# Patient Record
Sex: Male | Born: 1990 | Race: Black or African American | Hispanic: No | Marital: Single | Smoking: Current some day smoker
Health system: Southern US, Community
[De-identification: ages and names within clinical notes are randomized; demographics above are authoritative.]

---
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
## 2009-04-16 ENCOUNTER — Observation Stay (HOSPITAL_COMMUNITY): Admission: EM | Admit: 2009-04-16 | Discharge: 2009-04-16 | Payer: Self-pay | Admitting: Emergency Medicine

## 2009-05-11 ENCOUNTER — Encounter: Admission: RE | Admit: 2009-05-11 | Discharge: 2009-05-15 | Payer: Self-pay | Admitting: Plastic Surgery

## 2009-05-15 ENCOUNTER — Encounter: Admission: RE | Admit: 2009-05-15 | Discharge: 2009-07-17 | Payer: Self-pay | Admitting: Plastic Surgery

## 2010-08-14 LAB — POCT I-STAT, CHEM 8
BUN: 20 mg/dL (ref 6–23)
Calcium, Ion: 1.02 mmol/L — ABNORMAL LOW (ref 1.12–1.32)
Chloride: 109 meq/L (ref 96–112)
Creatinine, Ser: 1.2 mg/dL (ref 0.4–1.5)
Glucose, Bld: 132 mg/dL — ABNORMAL HIGH (ref 70–99)
HCT: 49 % (ref 39.0–52.0)
Hemoglobin: 16.7 g/dL (ref 13.0–17.0)
Potassium: 4.1 mEq/L (ref 3.5–5.1)
Sodium: 143 mEq/L (ref 135–145)
TCO2: 23 mmol/L (ref 0–100)

## 2015-05-22 ENCOUNTER — Emergency Department (HOSPITAL_COMMUNITY)
Admission: EM | Admit: 2015-05-22 | Discharge: 2015-05-22 | Disposition: A | Discharge status: Home / Self Care | Payer: Self-pay | Attending: Emergency Medicine | Admitting: Emergency Medicine

## 2015-05-22 ENCOUNTER — Encounter (HOSPITAL_COMMUNITY): Payer: Self-pay | Admitting: Family Medicine

## 2015-05-22 ENCOUNTER — Emergency Department (HOSPITAL_COMMUNITY): Payer: Self-pay

## 2015-05-22 DIAGNOSIS — S0093XA Contusion of unspecified part of head, initial encounter: Secondary | ICD-10-CM | POA: Insufficient documentation

## 2015-05-22 DIAGNOSIS — Y998 Other external cause status: Secondary | ICD-10-CM | POA: Insufficient documentation

## 2015-05-22 DIAGNOSIS — S199XXA Unspecified injury of neck, initial encounter: Secondary | ICD-10-CM | POA: Insufficient documentation

## 2015-05-22 DIAGNOSIS — W000XXA Fall on same level due to ice and snow, initial encounter: Secondary | ICD-10-CM | POA: Insufficient documentation

## 2015-05-22 DIAGNOSIS — W19XXXA Unspecified fall, initial encounter: Secondary | ICD-10-CM

## 2015-05-22 DIAGNOSIS — Y9389 Activity, other specified: Secondary | ICD-10-CM | POA: Insufficient documentation

## 2015-05-22 DIAGNOSIS — F17200 Nicotine dependence, unspecified, uncomplicated: Secondary | ICD-10-CM | POA: Insufficient documentation

## 2015-05-22 DIAGNOSIS — Z791 Long term (current) use of non-steroidal anti-inflammatories (NSAID): Secondary | ICD-10-CM | POA: Insufficient documentation

## 2015-05-22 DIAGNOSIS — S0012XA Contusion of left eyelid and periocular area, initial encounter: Secondary | ICD-10-CM | POA: Insufficient documentation

## 2015-05-22 DIAGNOSIS — S0990XA Unspecified injury of head, initial encounter: Secondary | ICD-10-CM

## 2015-05-22 DIAGNOSIS — Y9289 Other specified places as the place of occurrence of the external cause: Secondary | ICD-10-CM | POA: Insufficient documentation

## 2015-05-22 MED ORDER — FLUORESCEIN SODIUM 1 MG OP STRP
1.0000 | ORAL_STRIP | Freq: Once | OPHTHALMIC | Status: AC
  Administered 2015-05-22: 1 via OPHTHALMIC
  Filled 2015-05-22: qty 1

## 2015-05-22 MED ORDER — IBUPROFEN 800 MG PO TABS: 800.0000 mg | ORAL_TABLET | Freq: Three times a day (TID) | ORAL | Status: DC

## 2015-05-22 MED ORDER — TETRACAINE HCL 0.5 % OP SOLN
2.0000 [drp] | Freq: Once | OPHTHALMIC | Status: AC
  Administered 2015-05-22: 2 [drp] via OPHTHALMIC
  Filled 2015-05-22: qty 2

## 2015-05-22 NOTE — Discharge Instructions (Signed)
Take your medication as prescribed as needed for pain relief. I also recommend applying ice for 15-20 minutes 3-4 times daily to help with your pain and swelling. Please follow up with a primary care provider from the Resource Guide provided below in 3-4 days. Please return to the Emergency Department if symptoms worsen or new onset of fever, headache, visual changes, lightheadedness, dizziness, numbness, tingling, weakness, change in mental status, seemed to be, seizure.   Emergency Department Resource Guide 1) Find a Doctor and Pay Out of Pocket Although you won't have to find out who is covered by your insurance plan, it is a good idea to ask around and get recommendations. You will then need to call the office and see if the doctor you have chosen will accept you as a new patient and what types of options they offer for patients who are self-pay. Some doctors offer discounts or will set up payment plans for their patients who do not have insurance, but you will need to ask so you aren't surprised when you get to your appointment.  2) Contact Your Local Health Department Not all health departments have doctors that can see patients for sick visits, but many do, so it is worth a call to see if yours does. If you don't know where your local health department is, you can check in your phone book. The CDC also has a tool to help you locate your state's health department, and many state websites also have listings of all of their local health departments.  3) Find a Walk-in Clinic If your illness is not likely to be very severe or complicated, you may want to try a walk in clinic. These are popping up all over the country in pharmacies, drugstores, and shopping centers. They're usually staffed by nurse practitioners or physician assistants that have been trained to treat common illnesses and complaints. They're usually fairly quick and inexpensive. However, if you have serious medical issues or chronic  medical problems, these are probably not your best option.  No Primary Care Doctor: - Call Health Connect at  [phone] - they can help you locate a primary care doctor that  accepts your insurance, provides certain services, etc. - Physician Referral Service- [phone]  Chronic Pain Problems: Organization         Address  Phone   Notes  [facility]  [phone] Patients need to be referred by their primary care doctor.   Medication Assistance: Organization         Address  Phone   Notes  [facility] [address] [place], [place] [address] [phone]) [phone] --Must be a resident of [place] -- Must have NO insurance coverage whatsoever (no Medicaid/ Medicare, etc.) -- The pt. MUST have a primary care doctor that directs their care regularly and follows them in the community   MedAssist  [phone]) [phone]   [employer]  [phone]    Agencies that provide inexpensive medical care: Organization         Address  Phone   Notes  [facility]  [phone]   [facility]    [phone]   [facility] [address] [place], [place] [address] [phone]   [facility] [place] [address], [place] [phone]   [facility]    [phone]   [facility]    [phone]) [phone]  Community Health and [place]  [address], [place] Phone:  [phone], Fax:  [phone] Hours of Operation:  9 am - 6 pm, M-F.  Also accepts Medicaid/Medicare and self-pay.  [facility] for [place] [address], [address], [place] Phone: [phone], Fax: [phone]. Hours of Operation:  8:30 am - 5:30 pm, M-F.  Also accepts Medicaid and self-pay.  [facility] [place] [address] [place] Phone: [phone]   [place], [place], [place] [phone],  Ext. [phone] Mondays & Thursdays: 7-9 AM.  First 15 patients are seen on a first come, first serve basis.    [place] Providers:  Organization         Address  Phone   Notes  [facility] [address], [place] [phone] Also accepts self-pay patients.  [facility] [id number] [place], [place]  [phone]   [place], [address], [place] [phone]   [facility] [address], [place] [phone]   Lucianne Lei [address], [place]   [phone] Only accepts [place] Access [place] patients after they have their name applied to their card.   Self-Pay (no insurance) in [facility]:  Organization         Address  Phone   Notes  Sickle Cell Patients, [facility] [place] [phone]   [facility] Care [place] [phone]   [facility] [place]  [place], [place], [place] [phone]   [facility]/Dr. Osei-Bonsu  [address], [place] [phone] Phone number for both [place] and [place] locations is the same.  Urgent Medical and [facility] [address] [place] [phone]   [facility] [address], [place] or [address] [phone]   [facility] [address], [place] [phone], phone; [phone], fax Sees patients 1st and 3rd Saturday of every month.  Must not qualify for public or private insurance (i.e. Medicaid, Medicare, [place] Health Choice, Veterans' Benefits)  Household income should be no more than 200% of the poverty level The clinic cannot treat you if you are pregnant or think you are pregnant  Sexually transmitted diseases are not  treated at the clinic.    Dental Care: Organization         Address  Phone  Notes  [facility] Department of [facility] [place] [phone] Accepts children up to age 60 who are enrolled in [place] or [place]; pregnant women with a Medicaid card; and children who have applied for Medicaid or [place] Health Choice, but were declined, whose parents can pay a reduced fee at time of service.  [facility] Department of [facility]  [address], [place] [phone] Accepts children up to age 20 who are enrolled in [place] or [place]; pregnant women with a Medicaid card; and children who have applied for Medicaid or [place] Health Choice, but were declined, whose parents can pay a reduced fee at time of service.  [place]  [phone]  [place] [phone] Patients are seen by appointment only. Walk-ins are not accepted. [place] will see patients 19 years of age and older. Monday - Tuesday (8am-5pm) Most Wednesdays (8:30-5pm) $30 per visit, cash only  [facility] Adult Dental Access PROGRAM  [address], [facility] [phone] Patients are seen by appointment only. Walk-ins are not accepted. [place] will see patients 68 years of age and older. One Wednesday Evening (Monthly: Volunteer Based).  $30 per visit, cash only  [place]  [phone] for adults; Children under age 42, call Graduate Pediatric Dentistry at [phone]. Children aged 50-14, please call [phone] to request a pediatric application.  Dental services are provided in all areas of dental care including fillings, crowns and bridges, complete and partial dentures, implants, gum treatment, root canals, and extractions. Preventive care is also provided. Treatment is provided to both adults and children. Patients are selected via a lottery and there is  often a waiting list.   [facility] [address] [place]  [phone] [email or web address]   [facility] [address] [place], [place] [phone], Ext. [phone] Second and Fourth Thursday of each month, opens at 6:30 AM; Clinic ends at 9 AM.  Patients are seen on a first-come first-served basis, and a limited number are seen during each clinic.   [facility]  [address] Hillard Danker [place], [place] [phone]   Eligibility Requirements You must have lived in [place], [place], or [place] counties for at least the last three months.   You cannot be eligible for state or federal sponsored [employer], including [employer], [place], or [employer].   You generally cannot be eligible for healthcare insurance through your employer.    How to apply: Eligibility screenings are held every Tuesday and Wednesday afternoon from 1:00 pm until 4:00 pm. You do not need an appointment for the interview!  [facility] [address] [place], [place]   [place]  [place] Department  [place]  [phone]    Behavioral Health Resources in the Community: Intensive Outpatient Programs Organization         Address  Phone  Notes  [place]. [address] [place], [place] [phone]   [facility] Outpatient [address], [place], [place]   ADS: Alcohol & Drug Svcs [address], [place], [place]   [place] [address]  [place], [place] or [phone]   Substance Abuse Resources Organization         Address  Phone  Notes  Alcohol and Drug Services  [phone]   [place]  [phone]   The [place]   Chinita Pester  [phone]   Residential & Outpatient Substance  Abuse Program  [phone]   Psychological Services Organization         Address  Phone  Notes  [facility] [place]  [place]  [phone]   [place] [address] [place] or [phone]    Mobile Crisis Teams Organization         Address  Phone  Notes  [facility], Mobile Crisis Care Unit  [phone]   Assertive Psychotherapeutic Services  [address] [place], [place]  [facility] [place] [address] [place] [phone]    Self-Help/Support Groups Organization         Address  Phone             Notes  [facility] Health Assoc[facility] of [place] - variety of support groups  [place] Call for more information  Narcotics Anonymous (NA), Caring Services [address], [employer] [place]  2 meetings at this location   Special educational needs teacher         Address  Phone  Notes  [facility] [place],    [place]  [phone]   [facility]  [address], [place] [phone], [place], [place]   [place], [place] [phone] Admissions: 8am-3pm M-F  Incentives Substance [place] [address]    [place], [place] [phone]   [facility] [address] [place], [place], [place]   [facility] [address],  [place], [place]   [facility] Programs - Intensive Outpatient [address] Kristeen [address], [place], [place]   [facility].,  [place], [place] [phone] or [phone]   [facility]idential Treatment Services (RTS) [address] [place], [place] Accepts Medicaid  [facility] [place] [address],  [place] [phone] Substance Abuse/Addiction Treatment   [facility] Organization          Address  Phone  Notes  [facility]  [phone]   Domenic Schwab, PhD [address] [place]lis [place], [place]   [phone] or [phone]   [place], [place] [phone]   [facility] [address], [place], [place] [phone] Insurance/Medicaid/sponsorship through [facility] and Families [address] [place]                                    [place], [place] [phone] [place] [address] [place], [place] [phone]    Dr. Adele Schilder  [phone]   [facility] Dept. 1) [address], [place] 2) [place] 3)  [place] [address], [place] [phone]  [phone]   [place] [phone] or [phone] (After Hours)

## 2015-05-22 NOTE — ED Provider Notes (Signed)
CSN: [id number]     Arrival date & time 05/22/15  1310 History  By signing my name below, I, Larry Melendez, attest that this documentation has been prepared under the direction and in the presence of Larry HakeNicole Moncia Annas, PA-C. Electronically Signed: Tanda RockersMargaux Melendez, ED Scribe. 05/22/2015. 5:22 PM.   Chief Complaint  Patient presents with  . Fall   The history is provided by the patient. No language interpreter was used.     HPI Comments: Larry Melendez is a 25 y.o. male who presents to the Emergency Department complaining of sudden onset, constant, 7/10, left eye pain s/p ground level fall that occurred last night. Pt reports that he was playing in the snow last night when he slipped on ice and fell, landing face first on the ice. No LOC. He also complains of mild blurry vision to the left eye, watering to the eyes, left parietal head pain, and right lateral neck pain. He applied ice to the eye after the incident with some relief from the swelling. He has not taken anything for the pain. Denies fever, eye discharge, foreign body sensation to eye, lightheadedness, dizziness, shortness of breath, chest pain, back pain, numbness, tingling, weakness, or any other associated symptoms. Pt wears corrective lenses. He is not on any anticoagulants.   History reviewed. No pertinent past medical history. History reviewed. No pertinent past surgical history. History reviewed. No pertinent family history. Social History  Substance Use Topics  . Smoking status: Current Some Day Smoker  . Smokeless tobacco: None  . Alcohol Use: Yes    Review of Systems  Constitutional: Negative for fever.  Eyes: Positive for pain (Left) and visual disturbance (Blurry vision. ). Negative for discharge.       + Watering to left eye  Respiratory: Negative for shortness of breath.   Cardiovascular: Negative for chest pain.  Musculoskeletal: Positive for neck pain. Negative for back pain.  Neurological: Negative for dizziness,  weakness, light-headedness and numbness.  All other systems reviewed and are negative.  Allergies  Review of patient's allergies indicates no known allergies.  Home Medications   Prior to Admission medications   Medication Sig Start Date End Date Taking? Authorizing Provider  ibuprofen (ADVIL,MOTRIN) 800 MG tablet Take 1 tablet (800 mg total) by mouth 3 (three) times daily. 05/22/15   Barrett HenleNicole Elizabeth Traylon Schimming, PA-C   Triage Vitals:  BP 135/69 mmHg  Pulse 101  Temp(Src) 98.7 F (37.1 C) (Oral)  Resp 14  Ht 5\' 7"  (1.702 m)  Wt 145 lb 3 oz (65.857 kg)  BMI 22.73 kg/m2  SpO2 99%   Physical Exam  Constitutional: He is oriented to person, place, and time. He appears well-developed and well-nourished. No distress.  HENT:  Head: Normocephalic. Head is with contusion.    Right Ear: Tympanic membrane normal. No hemotympanum.  Left Ear: Tympanic membrane normal. No hemotympanum.  Nose: Nose normal. No epistaxis. Right sinus exhibits no maxillary sinus tenderness and no frontal sinus tenderness. Left sinus exhibits no maxillary sinus tenderness and no frontal sinus tenderness.  Mouth/Throat: Uvula is midline and oropharynx is clear and moist.  Eyes: Conjunctivae and EOM are normal. Pupils are equal, round, and reactive to light. Right eye exhibits no discharge. Left eye exhibits no discharge. No scleral icterus.  Slit lamp exam:      The left eye shows no corneal abrasion, no corneal ulcer, no foreign body, no hyphema and no fluorescein uptake.  IOP 18  Neck: Normal range of motion. Neck supple.  No tracheal deviation present.  Cardiovascular: Normal rate, regular rhythm, normal heart sounds and intact distal pulses.   Pulmonary/Chest: Effort normal and breath sounds normal. No respiratory distress.  Abdominal: Soft. Bowel sounds are normal. He exhibits no distension. There is no tenderness.  Musculoskeletal: Normal range of motion.  Neurological: He is alert and oriented to person, place,  and time. He has normal strength. No cranial nerve deficit or sensory deficit. Coordination normal.  Skin: Skin is warm and dry.  Psychiatric: He has a normal mood and affect. His behavior is normal.  Nursing note and vitals reviewed.   ED Course  Procedures (including critical care time)  DIAGNOSTIC STUDIES: Oxygen Saturation is 99% on RA, normal by my interpretation.    COORDINATION OF CARE: 4:45 PM-Discussed treatment plan which includes CT Maxillofacial with pt at bedside and pt agreed to plan.   Labs Review Labs Reviewed - No data to display  Imaging Review Ct Maxillofacial Wo Cm  05/22/2015  CLINICAL DATA:  25 year old male with history of trauma from a fall yesterday, with swollen left eye. EXAM: CT MAXILLOFACIAL WITHOUT CONTRAST TECHNIQUE: Multidetector CT imaging of the maxillofacial structures was performed. Multiplanar CT image reconstructions were also generated. A small metallic BB was placed on the right temple in order to reliably differentiate right from left. COMPARISON:  No priors. FINDINGS: No acute displaced facial bone fractures. Specifically, pterygoid plates are intact, mandible is intact, mandibular condyles are located bilaterally, and bilateral orbits are intact. Small amount of soft tissue swelling around the left orbit, but the left globe itself and retro-orbital soft tissues are grossly normal in appearance. IMPRESSION: 1. No evidence of significant acute traumatic injury to the facial bones. 2. Mild soft tissue swelling around the left orbit, without acute abnormality of the left globe or retro-orbital soft tissues. Electronically Signed   By: Trudie Reed M.D.   On: 05/22/2015 18:18   I have personally reviewed and evaluated these images as part of my medical decision-making.  Filed Vitals:   05/22/15 1335 05/22/15 1825  BP: 135/69 130/58  Pulse: 101 100  Temp: 98.7 F (37.1 C) 98.7 F (37.1 C)  Resp: 14 16     MDM   Final diagnoses:  Head  injury, initial encounter  Fall, initial encounter   Patient presents with left eye pain and swelling after falling on ice last night. Denies LOC. VSS. Exam revealed mild swelling and ecchymosis to left upper eyelid and tenderness along left supraorbital region. Eye exam unremarkable, no fluroscein uptake. IOP 18. CT maxillofacial revealed no significant acute traumatic injury to facial bones. Plan to discharge patient home with NSAIDs, stress symptomatic treatment. Patient given resource guide to follow up with PCP.  Evaluation does not show pathology requring ongoing emergent intervention or admission. Pt is hemodynamically stable and mentating appropriately. Discussed findings/results and plan with patient/guardian, who agrees with plan. All questions answered. Return precautions discussed and outpatient follow up given.    I personally performed the services described in this documentation, which was scribed in my presence. The recorded information has been reviewed and is accurate.      Satira Sark [place], [place] 05/22/15 2250  Linwood Dibbles, MD 05/23/15 [phone]

## 2015-05-22 NOTE — ED Notes (Addendum)
Pt had slip and fall last night and hit head and left eye. Denies any dizziness, light headedness,nausea. Left eye swollen shut and bump to head.

## 2016-05-30 IMAGING — CT CT MAXILLOFACIAL W/O CM
3 series · 17 of 47 positions shown, 20 images · non-contrast
Comparison: No priors.

CLINICAL DATA: 24-year-old male with history of trauma from a fall
yesterday, with swollen left eye.

EXAM:
CT MAXILLOFACIAL WITHOUT CONTRAST
TECHNIQUE: Multidetector CT imaging of the maxillofacial structures was
performed. Multiplanar CT image reconstructions were also generated.
A small metallic BB was placed on the right temple in order to
reliably differentiate right from left.

[Series 201: facial bones, idose (1) · axial · 0.33mm/px · z∈[+30,+162]mm · 11 of 78 slices shown, 14 images]
[im 6/78  brain]
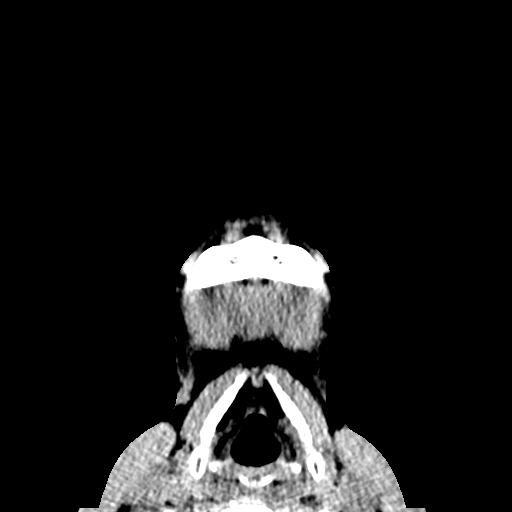
[im 6/78  bone]
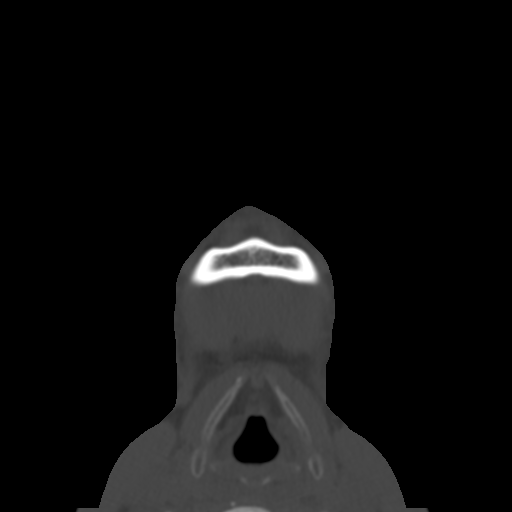
[im 11/78  bone]
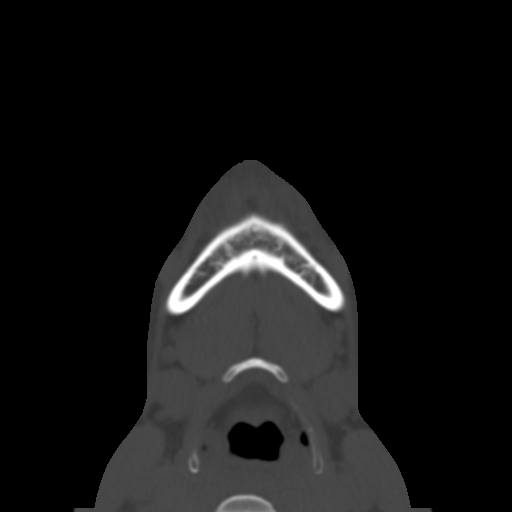
[im 19/78  bone]
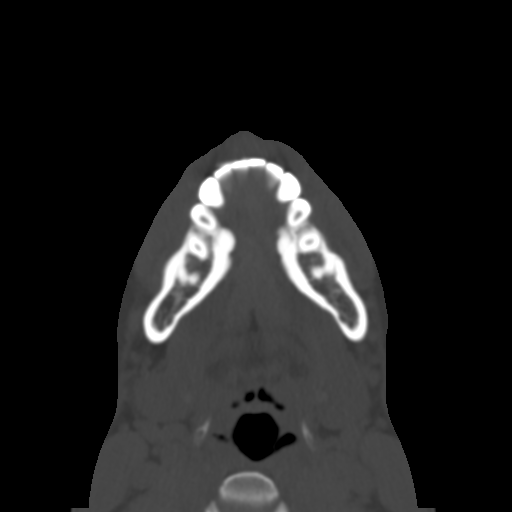
[im 24/78  bone]
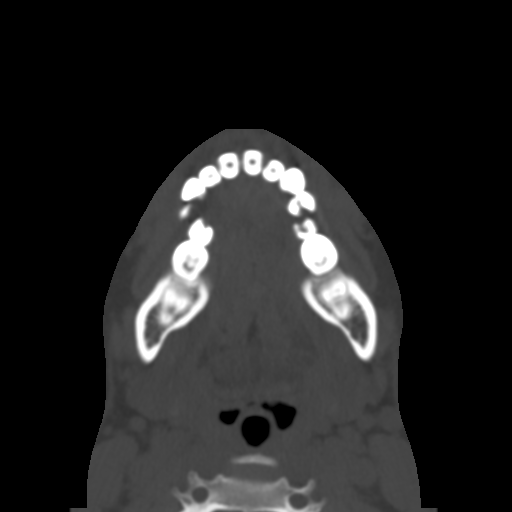
[im 32/78  brain]
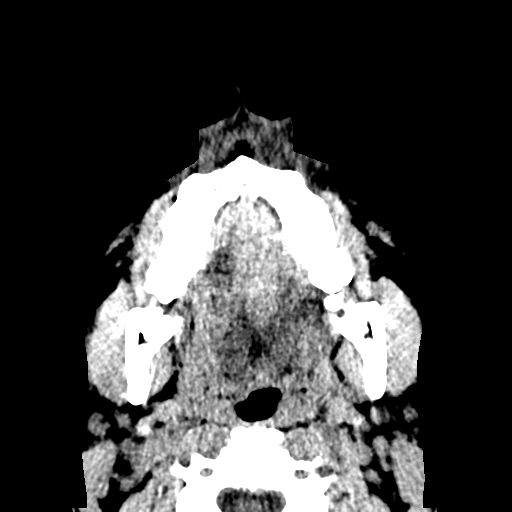
[im 32/78  bone]
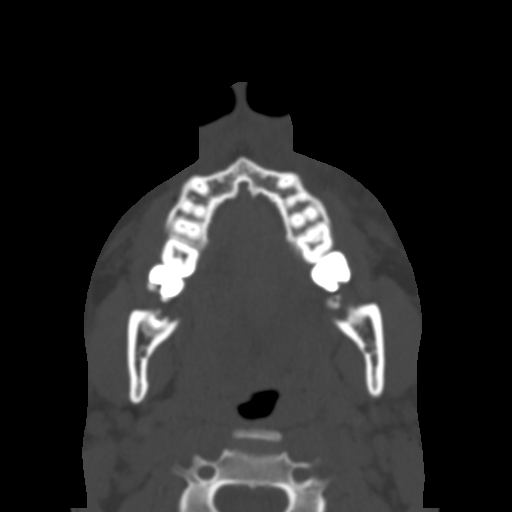
[im 40/78  bone]
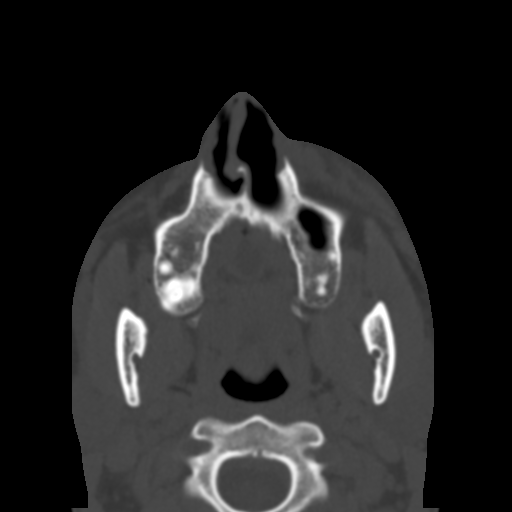
[im 46/78  bone]
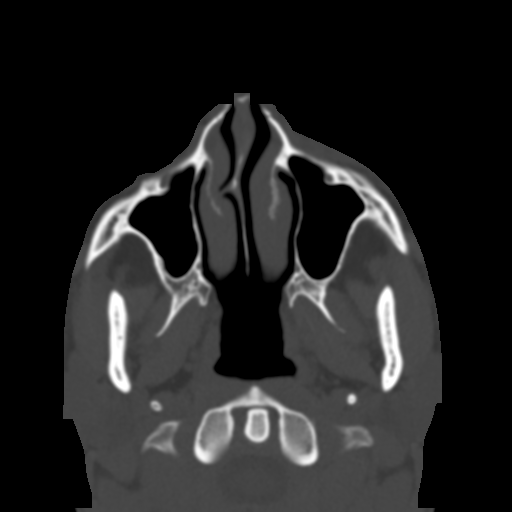
[im 54/78  bone]
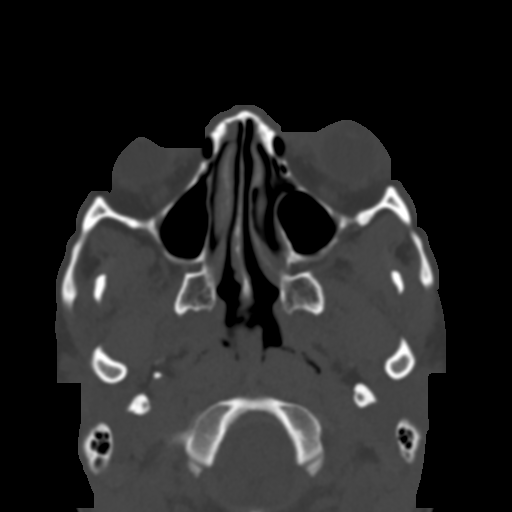
[im 59/78  brain]
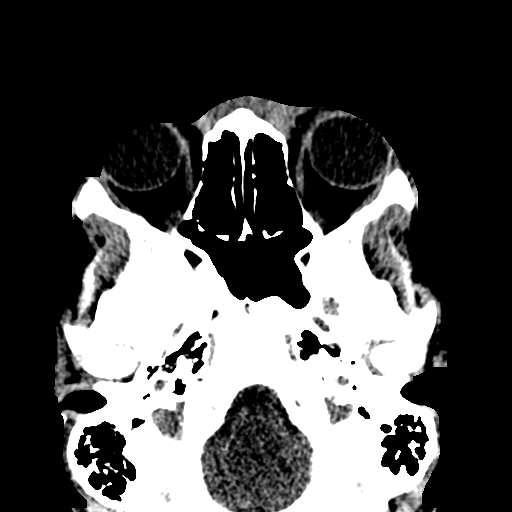
[im 59/78  bone]
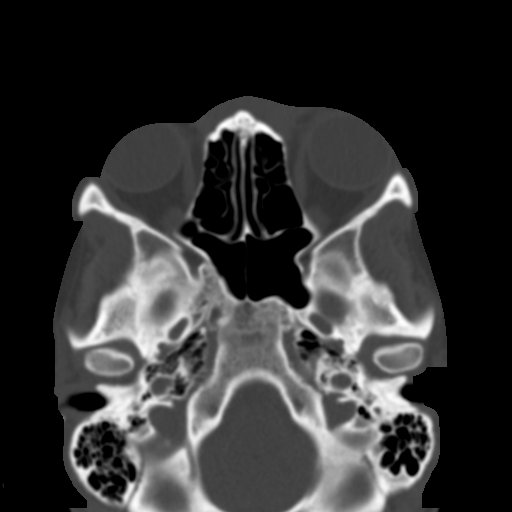
[im 67/78  bone]
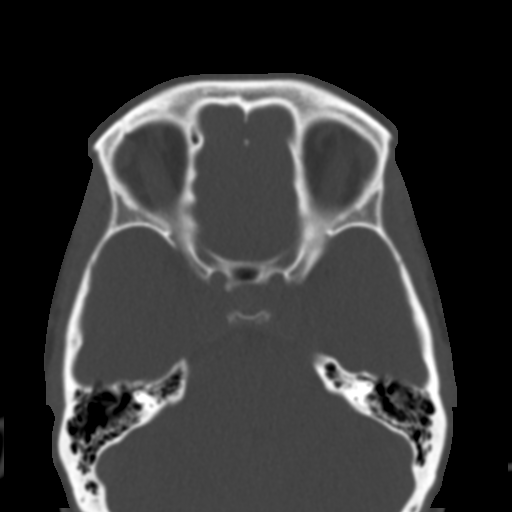
[im 72/78  bone]
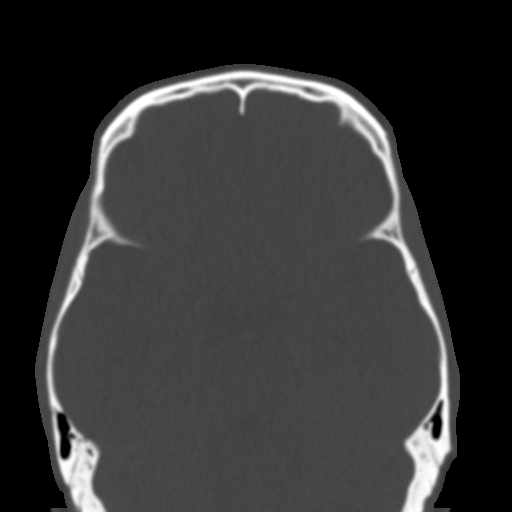

[Series 203: coronal std, idose (1) · coronal · 0.33mm/px · 3 of 75 slices shown]
[im 25/75  bone]
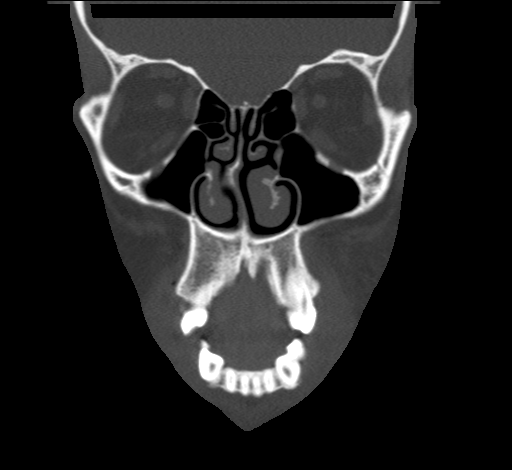
[im 33/75  bone]
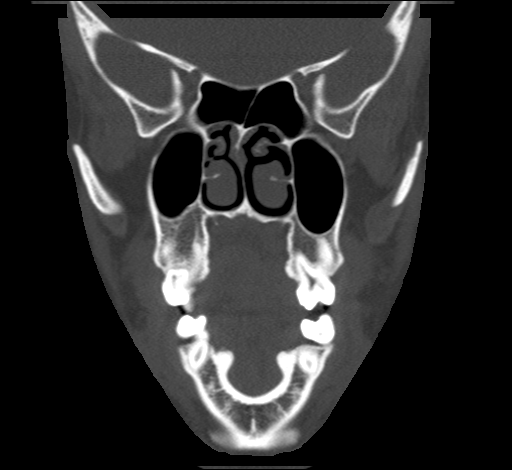
[im 42/75  bone]
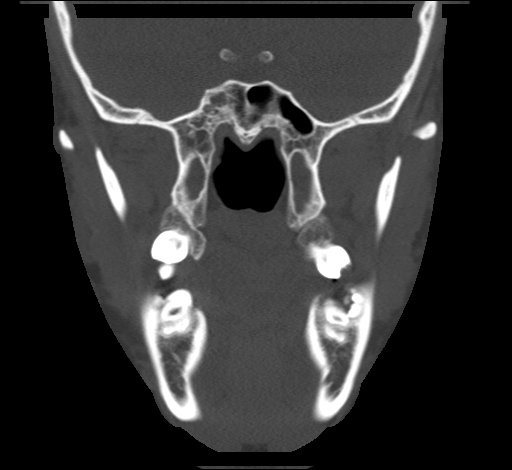

[Series 204: sagittal std, idose (1) · sagittal · 0.33mm/px · 3 of 83 slices shown]
[im 28/83  bone]
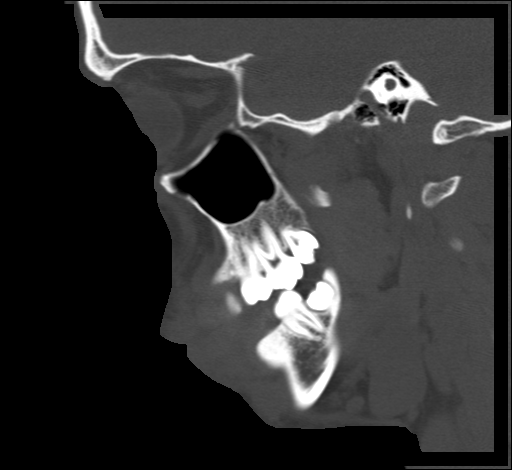
[im 42/83  bone]
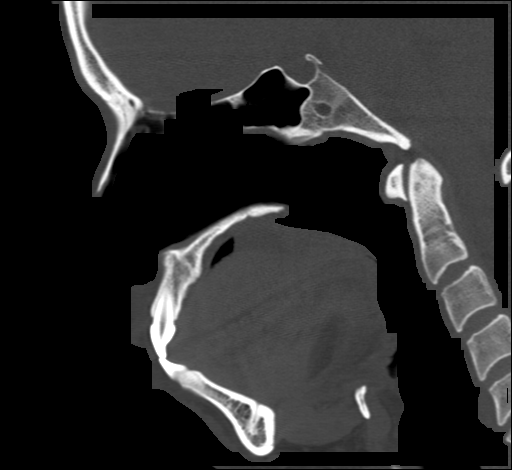
[im 55/83  bone]
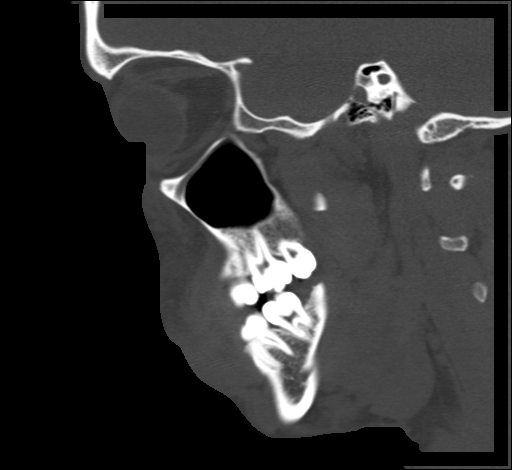

[17 of 47 positions shown; findings below may reference images not displayed]

FINDINGS: No acute displaced facial bone fractures. Specifically, pterygoid
plates are intact, mandible is intact, mandibular condyles are
located bilaterally, and bilateral orbits are intact. Small amount
of soft tissue swelling around the left orbit, but the left globe
itself and retro-orbital soft tissues are grossly normal in
appearance.
IMPRESSION: 1. No evidence of significant acute traumatic injury to the facial
bones.
2. Mild soft tissue swelling around the left orbit, without acute
abnormality of the left globe or retro-orbital soft tissues.

## 2018-02-09 ENCOUNTER — Other Ambulatory Visit: Payer: Self-pay

## 2018-02-09 ENCOUNTER — Emergency Department
Admission: EM | Admit: 2018-02-09 | Discharge: 2018-02-09 | Disposition: A | Discharge status: Home / Self Care | Payer: Self-pay | Attending: Emergency Medicine | Admitting: Emergency Medicine

## 2018-02-09 DIAGNOSIS — F17200 Nicotine dependence, unspecified, uncomplicated: Secondary | ICD-10-CM | POA: Insufficient documentation

## 2018-02-09 DIAGNOSIS — S61211A Laceration without foreign body of left index finger without damage to nail, initial encounter: Secondary | ICD-10-CM | POA: Insufficient documentation

## 2018-02-09 DIAGNOSIS — Z23 Encounter for immunization: Secondary | ICD-10-CM | POA: Insufficient documentation

## 2018-02-09 DIAGNOSIS — Y929 Unspecified place or not applicable: Secondary | ICD-10-CM | POA: Insufficient documentation

## 2018-02-09 DIAGNOSIS — Y939 Activity, unspecified: Secondary | ICD-10-CM | POA: Insufficient documentation

## 2018-02-09 DIAGNOSIS — Y998 Other external cause status: Secondary | ICD-10-CM | POA: Insufficient documentation

## 2018-02-09 DIAGNOSIS — W228XXA Striking against or struck by other objects, initial encounter: Secondary | ICD-10-CM | POA: Insufficient documentation

## 2018-02-09 MED ORDER — LIDOCAINE HCL (PF) 1 % IJ SOLN
5.0000 mL | Freq: Once | INTRAMUSCULAR | Status: DC
  Filled 2018-02-09: qty 5

## 2018-02-09 MED ORDER — TETANUS-DIPHTH-ACELL PERTUSSIS 5-2.5-18.5 LF-MCG/0.5 IM SUSP
0.5000 mL | Freq: Once | INTRAMUSCULAR | Status: AC
  Administered 2018-02-09: 0.5 mL via INTRAMUSCULAR
  Filled 2018-02-09: qty 0.5

## 2018-02-09 NOTE — ED Triage Notes (Signed)
Pt states he cut his left pointer finger on an AC unit today. Bandage in place on arrival, bleeding controlled.

## 2018-02-09 NOTE — ED Notes (Signed)
Pt ambulatory to POV without difficulty. VSS. NAD. Discharge instructions and follow up dicussed. All questions and concerns addressed.

## 2018-02-09 NOTE — ED Notes (Signed)
See triage note  States he cut his left index finger on air conditioner this am  Small laceration noted at knuckle area

## 2018-02-09 NOTE — Discharge Instructions (Signed)
Keep the wound clean, dry, and covered. See your provider or [facility] in 7-10 days for suture removal. Your Tetanus (Tdap) has been updated today.

## 2018-02-10 NOTE — ED Provider Notes (Signed)
[facility] Emergency Department Provider Note ____________________________________________  Time seen: 1338  I have reviewed the triage vital signs and the nursing notes.  HISTORY  Chief Complaint  Laceration  HPI Larry Melendez is a 27 y.o. male who presents himself to the ED accompanied by his family, for evaluation management of an accidental laceration to his left index finger.  Patient describes cutting the left index finger at the knuckle on the air conditioner unit.  He presents now with bleeding controlled, for further evaluation and management.  He is reporting an out of date tetanus booster at this time.  History reviewed. No pertinent past medical history.  There are no active problems to display for this patient.  History reviewed. No pertinent surgical history.  Prior to Admission medications   Medication Sig Start Date End Date Taking? Authorizing Provider  ibuprofen (ADVIL,MOTRIN) 800 MG tablet Take 1 tablet (800 mg total) by mouth 3 (three) times daily. 05/22/15   Barrett Henle, PA-C    Allergies Patient has no known allergies.  No family history on file.  Social History Social History   Tobacco Use  . Smoking status: Current Some Day Smoker  . Smokeless tobacco: Never Used  Substance Use Topics  . Alcohol use: Yes  . Drug use: No    Review of Systems  Constitutional: Negative for fever. Cardiovascular: Negative for chest pain. Respiratory: Negative for shortness of breath. Musculoskeletal: Negative for back pain. Skin: Negative for rash.  Index finger laceration as above. Neurological: Negative for headaches, focal weakness or numbness. ____________________________________________  PHYSICAL EXAM:  VITAL SIGNS: ED Triage Vitals  Enc Vitals Group     BP 02/09/18 1243 109/68     Pulse Rate 02/09/18 1243 66     Resp 02/09/18 1445 20     Temp 02/09/18 1243 98.5 F (36.9 C)     Temp Source 02/09/18 1243 Oral   SpO2 02/09/18 1243 99 %     Weight 02/09/18 1243 160 lb (72.6 kg)     Height 02/09/18 1243 5\' 8"  (1.727 m)     Head Circumference --      Peak Flow --      Pain Score 02/09/18 1217 8     Pain Loc --      Pain Edu? --      Excl. in GC? --     Constitutional: Alert and oriented. Well appearing and in no distress. Head: Normocephalic and atraumatic. Eyes: Conjunctivae are normal. Normal extraocular movements Cardiovascular: Normal rate, regular rhythm. Normal distal pulses & capillary refill. Respiratory: Normal respiratory effort. No wheezes/rales/rhonchi. Musculoskeletal: Composite fist on the left.  Patient with a 1.5 similar laceration across the dorsal PIP.  Normal composite fist.  Nontender with normal range of motion in all extremities.  Neurologic:  Normal gross sensation. Normal speech and language. No gross focal neurologic deficits are appreciated. Skin:  Skin is warm, dry and intact. No rash noted. ____________________________________________  PROCEDURES Tdap 0.5 ml IM  .Marland KitchenLaceration Repair Date/Time: 02/10/2018 11:59 PM Performed by: Lissa Hoard, PA-C Authorized by: Lissa Hoard, PA-C   Consent:    Consent obtained:  Verbal   Consent given by:  Patient   Risks discussed:  Poor wound healing and pain   Alternatives discussed:  Observation Anesthesia (see MAR for exact dosages):    Anesthesia method:  Nerve block   Block location:  Flexor tendon   Block anesthetic:  Lidocaine 1% w/o epi  Block technique:  Transthecal    Block injection procedure:  Anatomic landmarks palpated, introduced needle and incremental injection   Block outcome:  Anesthesia achieved Laceration details:    Location:  Finger   Finger location:  L index finger   Length (cm):  1.5 Repair type:    Repair type:  Simple Pre-procedure details:    Preparation:  Patient was prepped and draped in usual sterile fashion Exploration:    Contaminated: no   Treatment:    Area  cleansed with:  Betadine   Amount of cleaning:  Standard   Irrigation solution:  Sterile saline   Irrigation method:  Syringe Skin repair:    Repair method:  Sutures   Suture size:  4-0   Suture material:  Nylon   Suture technique:  Simple interrupted   Number of sutures:  3 Approximation:    Approximation:  Close Post-procedure details:    Dressing:  Non-adherent dressing   Patient tolerance of procedure:  Tolerated well, no immediate complications  ___________________________________________  INITIAL IMPRESSION / ASSESSMENT AND PLAN / ED COURSE  Patient with ED evaluation and management of an axonal laceration to left index finger.  Patient superficial wound is closed using sutures.  He is discharged with wound care instructions, and will follow with his primary provider in 7 to 10 days for suture removal.  Finger splint is provided for support as needed. ____________________________________________  FINAL CLINICAL IMPRESSION(S) / ED DIAGNOSES  Final diagnoses:  Laceration of left index finger without foreign body without damage to nail, initial encounter      Lissa Hoard, PA-C 02/11/18 0001    Arnaldo Natal, MD 02/13/18 [phone]

## 2018-06-03 ENCOUNTER — Encounter: Payer: Self-pay | Admitting: Emergency Medicine

## 2018-06-03 ENCOUNTER — Emergency Department
Admission: EM | Admit: 2018-06-03 | Discharge: 2018-06-03 | Disposition: A | Discharge status: Home / Self Care | Payer: Self-pay | Attending: Emergency Medicine | Admitting: Emergency Medicine

## 2018-06-03 ENCOUNTER — Emergency Department: Payer: Self-pay

## 2018-06-03 DIAGNOSIS — F17210 Nicotine dependence, cigarettes, uncomplicated: Secondary | ICD-10-CM | POA: Insufficient documentation

## 2018-06-03 DIAGNOSIS — J189 Pneumonia, unspecified organism: Secondary | ICD-10-CM

## 2018-06-03 DIAGNOSIS — J181 Lobar pneumonia, unspecified organism: Secondary | ICD-10-CM | POA: Insufficient documentation

## 2018-06-03 DIAGNOSIS — J111 Influenza due to unidentified influenza virus with other respiratory manifestations: Secondary | ICD-10-CM | POA: Insufficient documentation

## 2018-06-03 DIAGNOSIS — R040 Epistaxis: Secondary | ICD-10-CM | POA: Insufficient documentation

## 2018-06-03 MED ORDER — IPRATROPIUM-ALBUTEROL 0.5-2.5 (3) MG/3ML IN SOLN
3.0000 mL | Freq: Once | RESPIRATORY_TRACT | Status: AC
  Administered 2018-06-03: 3 mL via RESPIRATORY_TRACT
  Filled 2018-06-03: qty 3

## 2018-06-03 MED ORDER — IBUPROFEN 600 MG PO TABS: 600.0000 mg | ORAL_TABLET | Freq: Four times a day (QID) | ORAL | 0 refills | Status: DC | PRN

## 2018-06-03 MED ORDER — ACETAMINOPHEN 500 MG PO TABS: 500.0000 mg | ORAL_TABLET | Freq: Four times a day (QID) | ORAL | 0 refills | Status: DC | PRN

## 2018-06-03 MED ORDER — ALBUTEROL SULFATE HFA 108 (90 BASE) MCG/ACT IN AERS: 2.0000 | INHALATION_SPRAY | Freq: Four times a day (QID) | RESPIRATORY_TRACT | 0 refills | Status: DC | PRN

## 2018-06-03 MED ORDER — ACETAMINOPHEN 325 MG PO TABS
650.0000 mg | ORAL_TABLET | Freq: Once | ORAL | Status: AC | PRN
  Administered 2018-06-03: 650 mg via ORAL
  Filled 2018-06-03: qty 2

## 2018-06-03 MED ORDER — AZITHROMYCIN 250 MG PO TABS: ORAL_TABLET | ORAL | 0 refills | Status: DC

## 2018-06-03 MED ORDER — CEFTRIAXONE SODIUM 1 G IJ SOLR
1.0000 g | Freq: Once | INTRAMUSCULAR | Status: AC
  Administered 2018-06-03: 1 g via INTRAMUSCULAR
  Filled 2018-06-03: qty 10

## 2018-06-03 MED ORDER — BENZONATATE 100 MG PO CAPS: 100.0000 mg | ORAL_CAPSULE | Freq: Three times a day (TID) | ORAL | 0 refills | Status: DC | PRN

## 2018-06-03 NOTE — ED Triage Notes (Signed)
Patient presents to the ED with fever, cough, congestion, body aches and chills x 4 days.  Patient states his girlfriend's son recently had the flu.  Patient denies taking tylenol or ibuprofen today.  Reports taking mucinex.

## 2018-06-03 NOTE — ED Provider Notes (Signed)
[facility] Emergency Department Provider Note  ____________________________________________  Time seen: Approximately 3:49 PM  I have reviewed the triage vital signs and the nursing notes.   HISTORY  Chief Complaint Fever; Cough; and Nasal Congestion    HPI Larry Melendez is a 28 y.o. male that presents to the emergency department for evaluation of fever, chills, body aches, nasal congestion, sore throat, cough for 4 days. Cough is productive with progressively darker sputum. He noticed a streak of blood in his sputum this morning and had some mild epistaxis this morning. His girlfriends son was diagnosed with the flu last week. He smokes a half a pack of cigarettes a day. Overall he feels well. No SOB, CP, vomiting.    History reviewed. No pertinent past medical history.  There are no active problems to display for this patient.   History reviewed. No pertinent surgical history.  Prior to Admission medications   Medication Sig Start Date End Date Taking? Authorizing Provider  acetaminophen (TYLENOL) 500 MG tablet Take 1 tablet (500 mg total) by mouth every 6 (six) hours as needed. 06/03/18   Enid Derry, PA-C  albuterol (PROVENTIL HFA;VENTOLIN HFA) 108 (90 Base) MCG/ACT inhaler Inhale 2 puffs into the lungs every 6 (six) hours as needed for wheezing or shortness of breath. 06/03/18   Enid Derry, PA-C  azithromycin (ZITHROMAX Z-PAK) 250 MG tablet Take 2 tablets (500 mg) on  Day 1,  followed by 1 tablet (250 mg) once daily on Days 2 through 5. 06/03/18   Enid Derry, PA-C  benzonatate (TESSALON PERLES) 100 MG capsule Take 1 capsule (100 mg total) by mouth 3 (three) times daily as needed for cough. 06/03/18 06/03/19  Enid Derry, PA-C  ibuprofen (ADVIL,MOTRIN) 600 MG tablet Take 1 tablet (600 mg total) by mouth every 6 (six) hours as needed. 06/03/18   Enid Derry, PA-C    Allergies Patient has no known allergies.  No family history on  file.  Social History Social History   Tobacco Use  . Smoking status: Current Some Day Smoker    Packs/day: 0.50    Types: Cigarettes  . Smokeless tobacco: Never Used  Substance Use Topics  . Alcohol use: Yes  . Drug use: No     Review of Systems  Constitutional: Positive for fever. Eyes: No visual changes. No discharge. ENT: Positive for congestion and rhinorrhea. Cardiovascular: No chest pain. Respiratory: Positive for cough. No SOB. Gastrointestinal: No abdominal pain.  Positive for nausea. No vomiting.  No diarrhea.  No constipation. Musculoskeletal: Positive for body aches.  Skin: Negative for rash, abrasions, lacerations, ecchymosis. Neurological: Negative for headaches.   ____________________________________________   PHYSICAL EXAM:  VITAL SIGNS: ED Triage Vitals  Enc Vitals Group     BP 06/03/18 1344 109/61     Pulse Rate 06/03/18 1344 (!) 114     Resp 06/03/18 1344 18     Temp 06/03/18 1344 (!) 101.2 F (38.4 C)     Temp Source 06/03/18 1344 Oral     SpO2 06/03/18 1344 99 %     Weight 06/03/18 1347 150 lb (68 kg)     Height 06/03/18 1347 5\' 8"  (1.727 m)     Head Circumference --      Peak Flow --      Pain Score 06/03/18 1346 5     Pain Loc --      Pain Edu? --      Excl. in GC? --  Constitutional: Alert and oriented. Well appearing and in no acute distress. Eyes: Conjunctivae are normal. PERRL. EOMI. No discharge. Head: Atraumatic. ENT: No frontal and maxillary sinus tenderness.      Ears: Tympanic membranes pearly gray with good landmarks. No discharge.      Nose: Mild congestion/rhinnorhea.      Mouth/Throat: Mucous membranes are moist. Oropharynx non-erythematous. Tonsils not enlarged. No exudates. Uvula midline. Neck: No stridor.   Hematological/Lymphatic/Immunilogical: No cervical lymphadenopathy. Cardiovascular: Normal rate, regular rhythm.  Good peripheral circulation. Respiratory: Normal respiratory effort without tachypnea or  retractions. Lungs CTAB. Good air entry to the bases with no decreased or absent breath sounds. Gastrointestinal: Bowel sounds 4 quadrants. Soft and nontender to palpation. No guarding or rigidity. No palpable masses. No distention. Musculoskeletal: Full range of motion to all extremities. No gross deformities appreciated. Neurologic:  Normal speech and language. No gross focal neurologic deficits are appreciated.  Skin:  Skin is warm, dry and intact. No rash noted. Psychiatric: Mood and affect are normal. Speech and behavior are normal. Patient exhibits appropriate insight and judgement.   ____________________________________________   LABS (all labs ordered are listed, but only abnormal results are displayed)  Labs Reviewed - No data to display ____________________________________________  EKG   ____________________________________________  RADIOLOGY Lexine Baton, personally viewed and evaluated these images (plain radiographs) as part of my medical decision making, as well as reviewing the written report by the radiologist.  Dg Chest 2 View  Result Date: 06/03/2018 CLINICAL DATA:  Fever today with cough, nausea, congestion and mid chest pain for 4-5 days. Smoker. EXAM: CHEST - 2 VIEW COMPARISON:  None. FINDINGS: The heart size and mediastinal contours are normal. There are patchy airspace opacities at both lung bases which appear predominantly anteriorly located on the lateral view. The right heart border is partly obscured on the frontal examination. There is no pleural effusion or pneumothorax. The bones appear unremarkable. IMPRESSION: Bibasilar airspace opacities, predominately within the right middle lobe, most consistent with early bronchopneumonia. Electronically Signed   By: Carey Bullocks M.D.   On: 06/03/2018 15:33    ____________________________________________    PROCEDURES  Procedure(s) performed:    Procedures    Medications  acetaminophen (TYLENOL)  tablet 650 mg (650 mg Oral Given 06/03/18 1351)  ipratropium-albuterol (DUONEB) 0.5-2.5 (3) MG/3ML nebulizer solution 3 mL (3 mLs Nebulization Given 06/03/18 1618)  cefTRIAXone (ROCEPHIN) injection 1 g (1 g Intramuscular Given 06/03/18 1617)     ____________________________________________   INITIAL IMPRESSION / ASSESSMENT AND PLAN / ED COURSE  Pertinent labs & imaging results that were available during my care of the patient were reviewed by me and considered in my medical decision making (see chart for details).  Review of the [place] CSRS was performed in accordance of the [facility] prior to dispensing any controlled drugs.   Patient's diagnosis is consistent with influenza and pneumonia. Vital signs and exam are reassuring. Chest xray concerning for early pneumonia. Patient is outside of the window for influenza testing and to begin tamiflu. Symptoms are consistent with influenza. Patient appears well and is staying well hydrated. Overall, he says he feels well. Patient should alternate tylenol and ibuprofen for fever. Patient feels comfortable going home. IM Ceftriaxone was given. Duoneb was given. Patient will be discharged home with prescriptions for azithromycin, tessalon pearles, motrin, tylenol. Patient is to follow up with PCP as needed or otherwise directed. Patient is given ED precautions to return to the ED for any worsening or new symptoms.  ____________________________________________  FINAL CLINICAL IMPRESSION(S) / ED DIAGNOSES  Final diagnoses:  Community acquired pneumonia of right middle lobe of lung (HCC)  Influenza      NEW MEDICATIONS STARTED DURING THIS VISIT:  ED Discharge Orders         Ordered    azithromycin (ZITHROMAX Z-PAK) 250 MG tablet     06/03/18 1700    albuterol (PROVENTIL HFA;VENTOLIN HFA) 108 (90 Base) MCG/ACT inhaler  Every 6 hours PRN     06/03/18 1700    benzonatate (TESSALON PERLES) 100 MG capsule  3 times daily PRN     06/03/18 1700     acetaminophen (TYLENOL) 500 MG tablet  Every 6 hours PRN     06/03/18 1700    ibuprofen (ADVIL,MOTRIN) 600 MG tablet  Every 6 hours PRN     06/03/18 1700              This chart was dictated using voice recognition software/Dragon. Despite best efforts to proofread, errors can occur which can change the meaning. Any change was purely unintentional.    Enid DerryWagner, Mayrani Khamis, PA-C 06/03/18 2218    Nita SickleVeronese, , MD 06/03/18 [phone]

## 2018-06-03 NOTE — Discharge Instructions (Signed)
Your chest xray shows early pneumonia. Your symptoms are also consistent with influenza. You are outside of the window for influenza testing and outside of the window to begin the medicine for influenza. I gave you a shot of antibiotics here. Please begin antibiotics tomorrow. Use inhaler every 4-6 hours as needed for shortness of breath. Please alternate tylenol and Motrin every 4 hours for fever and body aches.  Drink plenty of fluids.  Return to the emergency department immediately for worsening symptoms, shortness of breath, chest pain.

## 2018-07-13 ENCOUNTER — Other Ambulatory Visit: Payer: Self-pay

## 2018-07-13 ENCOUNTER — Emergency Department
Admission: EM | Admit: 2018-07-13 | Discharge: 2018-07-13 | Disposition: A | Discharge status: Home / Self Care | Payer: Self-pay | Attending: Emergency Medicine | Admitting: Emergency Medicine

## 2018-07-13 ENCOUNTER — Encounter: Payer: Self-pay | Admitting: Emergency Medicine

## 2018-07-13 DIAGNOSIS — J101 Influenza due to other identified influenza virus with other respiratory manifestations: Secondary | ICD-10-CM | POA: Insufficient documentation

## 2018-07-13 DIAGNOSIS — F17210 Nicotine dependence, cigarettes, uncomplicated: Secondary | ICD-10-CM | POA: Insufficient documentation

## 2018-07-13 LAB — INFLUENZA PANEL BY PCR (TYPE A & B)
INFLAPCR: POSITIVE — AB
Influenza B By PCR: NEGATIVE

## 2018-07-13 MED ORDER — OSELTAMIVIR PHOSPHATE 75 MG PO CAPS: 75.0000 mg | ORAL_CAPSULE | Freq: Two times a day (BID) | ORAL | 0 refills | Status: AC

## 2018-07-13 MED ORDER — IBUPROFEN 600 MG PO TABS: 600.0000 mg | ORAL_TABLET | Freq: Three times a day (TID) | ORAL | 0 refills | Status: AC | PRN

## 2018-07-13 MED ORDER — PROMETHAZINE-DM 6.25-15 MG/5ML PO SYRP: 5.0000 mL | ORAL_SOLUTION | Freq: Four times a day (QID) | ORAL | 0 refills | Status: AC | PRN

## 2018-07-13 NOTE — ED Triage Notes (Signed)
Pt arrives ambulatory with steady gait to triage with c/o headache and fever since last night. Pt is in NAD.

## 2018-07-13 NOTE — ED Notes (Signed)
See triage note  States he developed cough,subjective fever and body aches   States sxs' started last pm   Low grade fever noted on arrival

## 2018-07-13 NOTE — ED Provider Notes (Signed)
[facility] Emergency Department Provider Note   ____________________________________________   First MD Initiated Contact with Patient 07/13/18 [phone]     (approximate)  I have reviewed the triage vital signs and the nursing notes.   HISTORY  Chief Complaint Flu like symptoms    HPI Larry Melendez is a 28 y.o. male patient complain headache and fever started last night.  Patient denies nausea, vomiting, diarrhea.  Patient unsure if he taken the flu shot for this season.  Patient describes his pain is "achy".  Rates his pain discomfort a 7/10.  No palliative measure for complaint.      History reviewed. No pertinent past medical history.  There are no active problems to display for this patient.   History reviewed. No pertinent surgical history.  Prior to Admission medications   Medication Sig Start Date End Date Taking? Authorizing Provider  ibuprofen (ADVIL,MOTRIN) 600 MG tablet Take 1 tablet (600 mg total) by mouth every 8 (eight) hours as needed. 07/13/18   Joni Reining, PA-C  oseltamivir (TAMIFLU) 75 MG capsule Take 1 capsule (75 mg total) by mouth 2 (two) times daily for 5 days. 07/13/18 07/18/18  Joni Reining, PA-C  promethazine-dextromethorphan (PROMETHAZINE-DM) 6.25-15 MG/5ML syrup Take 5 mLs by mouth 4 (four) times daily as needed for cough. 07/13/18   Joni Reining, PA-C    Allergies Patient has no known allergies.  No family history on file.  Social History Social History   Tobacco Use  . Smoking status: Current Some Day Smoker    Packs/day: 0.50    Types: Cigarettes  . Smokeless tobacco: Never Used  Substance Use Topics  . Alcohol use: Yes  . Drug use: No    Review of Systems Constitutional: No fever/chills.  Body aches Eyes: No visual changes. ENT: No sore throat. Cardiovascular: Denies chest pain. Respiratory: Denies shortness of breath. Gastrointestinal: No abdominal pain.  No nausea, no vomiting.  No diarrhea.  No  constipation. Genitourinary: Negative for dysuria. Musculoskeletal: Negative for back pain. Skin: Negative for rash. Neurological: Positive for headaches, but denies focal weakness or numbness.   ____________________________________________   PHYSICAL EXAM:  VITAL SIGNS: ED Triage Vitals  Enc Vitals Group     BP 07/13/18 0622 114/67     Pulse Rate 07/13/18 0622 (!) 108     Resp 07/13/18 0622 18     Temp 07/13/18 0622 99.1 F (37.3 C)     Temp Source 07/13/18 0622 Oral     SpO2 07/13/18 0622 96 %     Weight 07/13/18 0622 150 lb (68 kg)     Height 07/13/18 0622 5\' 8"  (1.727 m)     Head Circumference --      Peak Flow --      Pain Score 07/13/18 0621 7     Pain Loc --      Pain Edu? --      Excl. in GC? --     Constitutional: Alert and oriented. Well appearing and in no acute distress. Nose: Clear rhinorrhea  mouth/Throat: Mucous membranes are moist.  Oropharynx  erythematous. Neck: No stridor. . Hematological/Lymphatic/Immunilogical: No cervical lymphadenopathy. Cardiovascular: Normal rate, regular rhythm. Grossly normal heart sounds.  Good peripheral circulation. Respiratory: Normal respiratory effort.  No retractions. Lungs CTAB. Gastrointestinal: Soft and nontender. No distention. No abdominal bruits. No CVA tenderness. Neurologic:  Normal speech and language. No gross focal neurologic deficits are appreciated. No gait instability. Skin:  Skin is warm, dry and  intact. No rash noted. Psychiatric: Mood and affect are normal. Speech and behavior are normal.  ____________________________________________   LABS (all labs ordered are listed, but only abnormal results are displayed)  Labs Reviewed  INFLUENZA PANEL BY PCR (TYPE A & B) - Abnormal; Notable for the following components:      Result Value   Influenza A By PCR POSITIVE (*)    All other components within normal limits    ____________________________________________  EKG   ____________________________________________  RADIOLOGY  ED MD interpretation:    Official radiology report(s): No results found.  ____________________________________________   PROCEDURES  Procedure(s) performed (including Critical Care):  Procedures   ____________________________________________   INITIAL IMPRESSION / ASSESSMENT AND PLAN / ED COURSE  As part of my medical decision making, I reviewed the following data within the electronic MEDICAL RECORD NUMBER         Patient presents with acute onset fever, headache, and body aches started last night.  Patient test positive influenza A.  Patient given discharge care instruction work note.  Patient advised take medication as directed and [facility]      ____________________________________________   FINAL CLINICAL IMPRESSION(S) / ED DIAGNOSES  Final diagnoses:  Influenza A     ED Discharge Orders         Ordered    oseltamivir (TAMIFLU) 75 MG capsule  2 times daily     07/13/18 0743    promethazine-dextromethorphan (PROMETHAZINE-DM) 6.25-15 MG/5ML syrup  4 times daily PRN     07/13/18 0743    ibuprofen (ADVIL,MOTRIN) 600 MG tablet  Every 8 hours PRN     07/13/18 0743           Note:  This document was prepared using Dragon voice recognition software and may include unintentional dictation errors.    Joni Reining, PA-C 07/13/18 0745    Sharman Cheek, MD 07/17/18 [phone]

## 2019-06-12 IMAGING — CR DG CHEST 2V
1 series · 2 of 2 positions shown · non-contrast
Comparison: None.

CLINICAL DATA: Fever today with cough, nausea, congestion and mid
chest pain for 4-5 days. Smoker.

EXAM:
CHEST - 2 VIEW

[Series 1: w chest pa · 0.14mm/px · 2 of 2 slices shown]
[im 1/2]
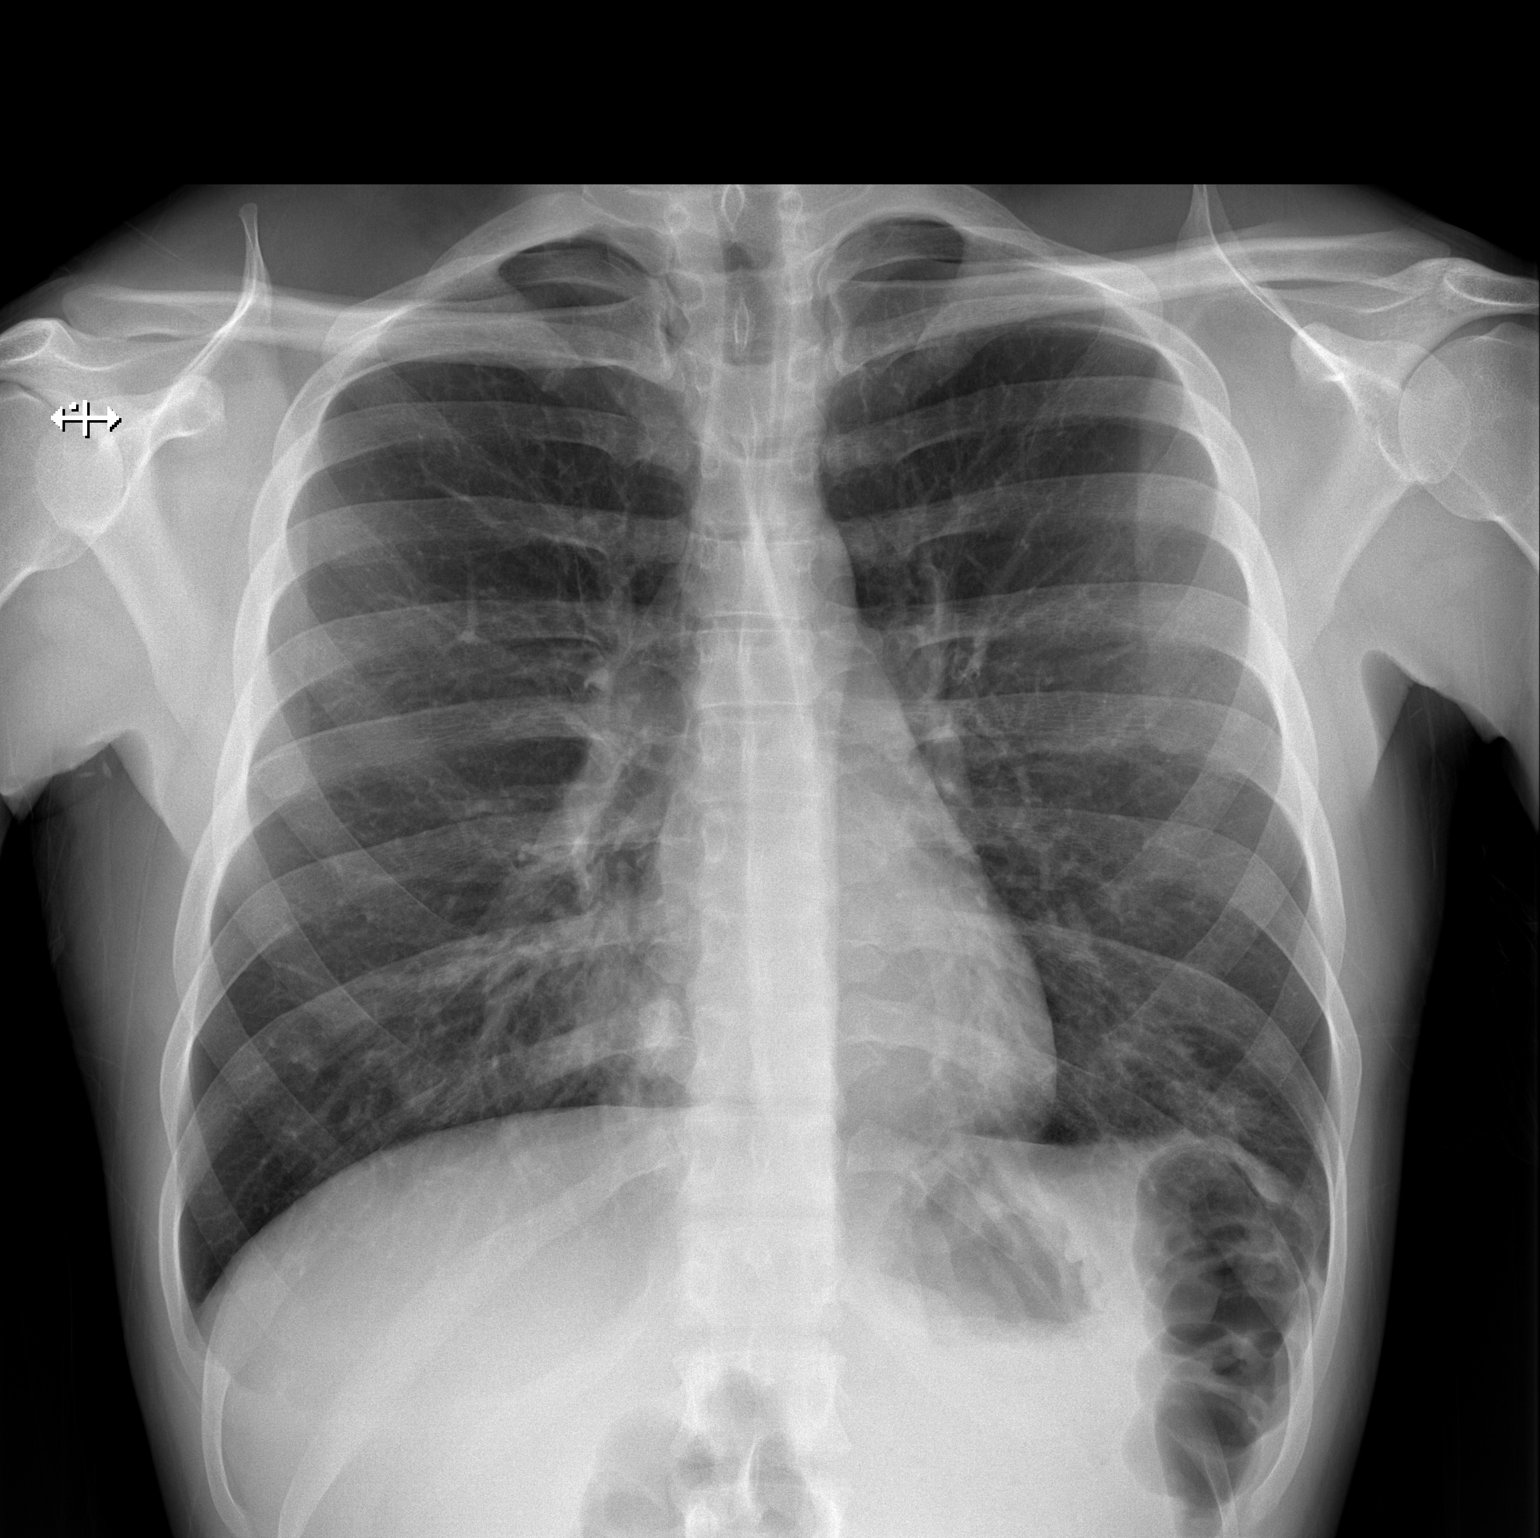
[im 2/2]
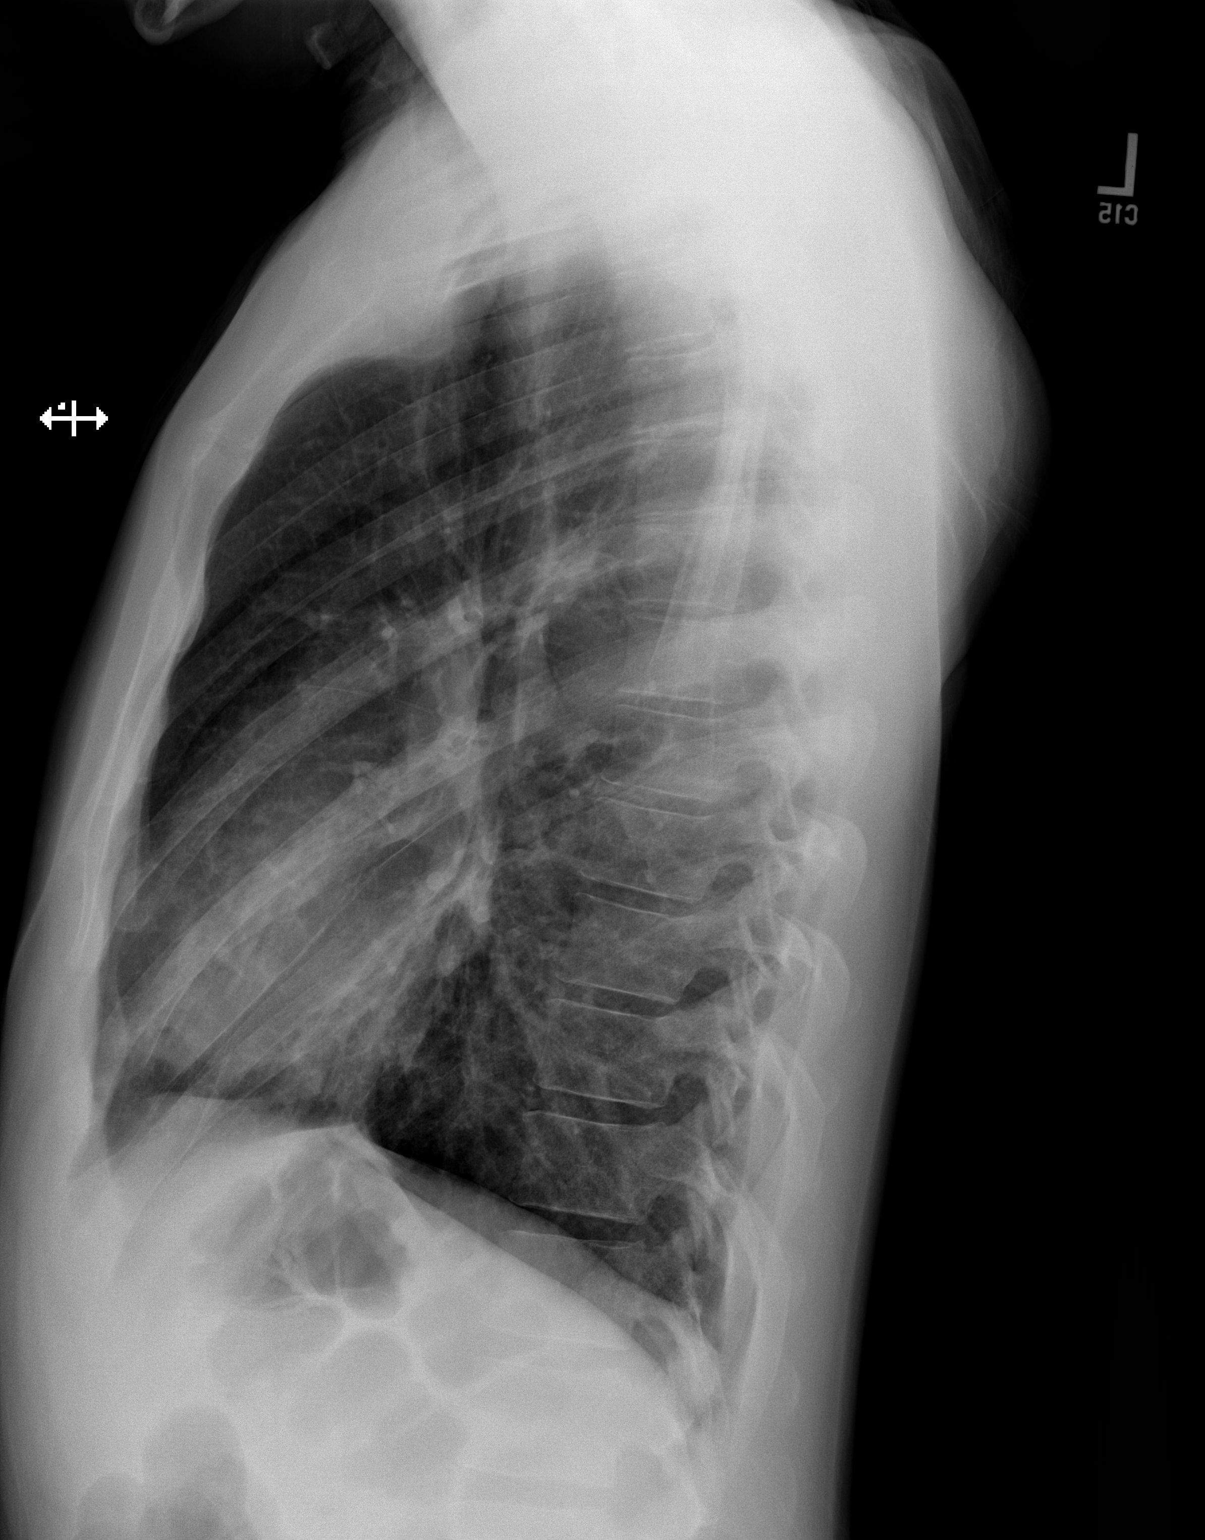

[2 of 2 positions shown; findings below may reference images not displayed]

FINDINGS: The heart size and mediastinal contours are normal. There are patchy
airspace opacities at both lung bases which appear predominantly
anteriorly located on the lateral view. The right heart border is
partly obscured on the frontal examination. There is no pleural
effusion or pneumothorax. The bones appear unremarkable.
IMPRESSION: Bibasilar airspace opacities, predominately within the right middle
lobe, most consistent with early bronchopneumonia.

## 2022-01-22 ENCOUNTER — Emergency Department
Admission: EM | Admit: 2022-01-22 | Discharge: 2022-01-22 | Discharge status: Against Medical Advice | Payer: Self-pay | Attending: Emergency Medicine | Admitting: Emergency Medicine

## 2022-01-22 DIAGNOSIS — Z5321 Procedure and treatment not carried out due to patient leaving prior to being seen by health care provider: Secondary | ICD-10-CM | POA: Insufficient documentation

## 2022-01-22 DIAGNOSIS — R519 Headache, unspecified: Secondary | ICD-10-CM | POA: Insufficient documentation

## 2022-01-22 NOTE — ED Triage Notes (Signed)
To triage via [facility] with c/o headache, concentrated to left side of face. Pt has hx of migraines, hasnt had one " in a while", states this one is worse.

## 2024-04-24 ENCOUNTER — Ambulatory Visit
Admission: EM | Admit: 2024-04-24 | Discharge: 2024-04-24 | Disposition: A | Discharge status: Home / Self Care | Payer: Self-pay | Attending: Emergency Medicine | Admitting: Emergency Medicine

## 2024-04-24 DIAGNOSIS — B001 Herpesviral vesicular dermatitis: Secondary | ICD-10-CM

## 2024-04-24 DIAGNOSIS — R220 Localized swelling, mass and lump, head: Secondary | ICD-10-CM

## 2024-04-24 MED ORDER — VALACYCLOVIR HCL 1 G PO TABS: 1000.0000 mg | ORAL_TABLET | Freq: Two times a day (BID) | ORAL | 0 refills | Status: AC

## 2024-04-24 MED ORDER — DEXAMETHASONE SOD PHOSPHATE PF 10 MG/ML IJ SOLN
10.0000 mg | Freq: Once | INTRAMUSCULAR | Status: AC
  Administered 2024-04-24: 10 mg via INTRAMUSCULAR

## 2024-04-24 NOTE — Discharge Instructions (Addendum)
 You were given an injection of dexamethasone  today.  Take the Valtrex  as directed.    Follow-up with your primary care provider if your symptoms are not improving.

## 2024-04-24 NOTE — ED Triage Notes (Signed)
 Pt being seen in UC for lip swelling that started 3 days ago. Pt unsure if something bit lip. Pt reports still being able to eat/drink, denies throat pain/swelling. Pt reports having swollen lymph nodes in neck a couple days prior to noticing lip swelling. Pt reports using otc medication for cold sores, no relief.

## 2024-04-24 NOTE — ED Provider Notes (Signed)
 CAY RALPH PELT    CSN: [id number] Arrival date & time: 04/24/24  1407      History   Chief Complaint Chief Complaint  Patient presents with   Oral Swelling    HPI Larry Melendez is a 32 y.o. male.  Patient presents with a sore on his lower lip and lower lip swelling x 3 days.  No trauma.  No fever, chills, difficulty swallowing, sore throat, cough, shortness of breath.  He started using OTC cold sore cream yesterday.  He reports no pertinent medical history.  The history is provided by the patient and medical records.    History reviewed. No pertinent past medical history.  There are no active problems to display for this patient.   History reviewed. No pertinent surgical history.     Home Medications    Prior to Admission medications  Medication Sig Start Date End Date Taking? Authorizing Provider  valACYclovir  (VALTREX ) 1000 MG tablet Take 1 tablet (1,000 mg total) by mouth 2 (two) times daily for 7 days. 04/24/24 05/01/24 Yes Corlis Burnard DEL, NP  ibuprofen  (ADVIL ,MOTRIN ) 600 MG tablet Take 1 tablet (600 mg total) by mouth every 8 (eight) hours as needed. Patient not taking: Reported on 04/24/2024 07/13/18   Claudene Tanda POUR, PA-C  promethazine -dextromethorphan (PROMETHAZINE -DM) 6.25-15 MG/5ML syrup Take 5 mLs by mouth 4 (four) times daily as needed for cough. Patient not taking: Reported on 04/24/2024 07/13/18   Claudene Tanda POUR, PA-C    Family History History reviewed. No pertinent family history.  Social History Social History[1]   Allergies   Patient has no known allergies.   Review of Systems Review of Systems  Constitutional:  Negative for chills and fever.  HENT:  Positive for facial swelling and mouth sores. Negative for sore throat, trouble swallowing and voice change.   Respiratory:  Negative for cough and shortness of breath.      Physical Exam Triage Vital Signs ED Triage Vitals  Encounter Vitals Group     BP 04/24/24 1423 124/76      Girls Systolic BP Percentile --      Girls Diastolic BP Percentile --      Boys Systolic BP Percentile --      Boys Diastolic BP Percentile --      Pulse Rate 04/24/24 1423 98     Resp 04/24/24 1423 19     Temp 04/24/24 1423 98.2 F (36.8 C)     Temp Source 04/24/24 1423 Oral     SpO2 04/24/24 1423 98 %     Weight --      Height --      Head Circumference --      Peak Flow --      Pain Score 04/24/24 1421 0     Pain Loc --      Pain Education --      Exclude from Growth Chart --    No data found.  Updated Vital Signs BP 124/76 (BP Location: Right Arm)   Pulse 98   Temp 98.2 F (36.8 C) (Oral)   Resp 19   SpO2 98%   Visual Acuity Right Eye Distance:   Left Eye Distance:   Bilateral Distance:    Right Eye Near:   Left Eye Near:    Bilateral Near:     Physical Exam Constitutional:      General: He is not in acute distress. HENT:     Right Ear: Tympanic membrane normal.  Left Ear: Tympanic membrane normal.     Nose: Nose normal.     Mouth/Throat:     Mouth: Mucous membranes are moist.     Comments: Moderate swelling of lower lip with clustered vesicular lesion.  No bleeding or drainage.  No lesions or swelling noted in mouth or throat.  Voice clear.  No difficulty swallowing. Cardiovascular:     Rate and Rhythm: Normal rate and regular rhythm.     Heart sounds: Normal heart sounds.  Pulmonary:     Effort: Pulmonary effort is normal. No respiratory distress.     Breath sounds: Normal breath sounds.  Neurological:     Mental Status: He is alert.      UC Treatments / Results  Labs (all labs ordered are listed, but only abnormal results are displayed) Labs Reviewed - No data to display  EKG   Radiology No results found.  Procedures Procedures (including critical care time)  Medications Ordered in UC Medications  dexamethasone  (DECADRON ) injection 10 mg (10 mg Intramuscular Given 04/24/24 1500)    Initial Impression / Assessment and Plan / UC  Course  I have reviewed the triage vital signs and the nursing notes.  Pertinent labs & imaging results that were available during my care of the patient were reviewed by me and considered in my medical decision making (see chart for details).    Lip swelling, cold sore.  Afebrile and vital signs are stable.  Dexamethasone  injection given here for lip swelling.  Treating cold sore with Valtrex .  Education provided on cold sore.  Instructed patient to follow-up with his PCP if he is not improving.  ED precautions given.  He agrees to plan of care.  Final Clinical Impressions(s) / UC Diagnoses   Final diagnoses:  Lip swelling  Cold sore     Discharge Instructions      You were given an injection of dexamethasone  today.  Take the Valtrex  as directed.    Follow-up with your primary care provider if your symptoms are not improving.      ED Prescriptions     Medication Sig Dispense Auth. Provider   valACYclovir  (VALTREX ) 1000 MG tablet Take 1 tablet (1,000 mg total) by mouth 2 (two) times daily for 7 days. 14 tablet Corlis Burnard DEL, NP      PDMP not reviewed this encounter.    [1]  Social History Tobacco Use   Smoking status: Some Days    Current packs/day: 0.50    Types: Cigarettes   Smokeless tobacco: Never  Vaping Use   Vaping status: Never Used  Substance Use Topics   Alcohol use: Yes   Drug use: No     Corlis Burnard DEL, NP 04/24/24 1511
